# Patient Record
Sex: Male | Born: 1970 | Race: White | Hispanic: No | Marital: Married | Smoking: Current every day smoker
Health system: Southern US, Community
[De-identification: ages and names within clinical notes are randomized; demographics above are authoritative.]

## PROBLEM LIST (undated history)

## (undated) DIAGNOSIS — E119 Type 2 diabetes mellitus without complications: Secondary | ICD-10-CM

## (undated) DIAGNOSIS — F4310 Post-traumatic stress disorder, unspecified: Secondary | ICD-10-CM

## (undated) DIAGNOSIS — K219 Gastro-esophageal reflux disease without esophagitis: Secondary | ICD-10-CM

## (undated) DIAGNOSIS — K635 Polyp of colon: Secondary | ICD-10-CM

## (undated) DIAGNOSIS — F419 Anxiety disorder, unspecified: Secondary | ICD-10-CM

## (undated) HISTORY — PX: TONSILLECTOMY: SUR1361

## (undated) HISTORY — DX: Type 2 diabetes mellitus without complications: E11.9

## (undated) HISTORY — DX: Polyp of colon: K63.5

---

## 2012-12-28 ENCOUNTER — Encounter (HOSPITAL_BASED_OUTPATIENT_CLINIC_OR_DEPARTMENT_OTHER): Payer: Self-pay | Admitting: *Deleted

## 2012-12-28 ENCOUNTER — Emergency Department (HOSPITAL_BASED_OUTPATIENT_CLINIC_OR_DEPARTMENT_OTHER): Payer: BC Managed Care – PPO

## 2012-12-28 ENCOUNTER — Emergency Department (HOSPITAL_BASED_OUTPATIENT_CLINIC_OR_DEPARTMENT_OTHER)
Admission: EM | Admit: 2012-12-28 | Discharge: 2012-12-28 | Disposition: A | Discharge status: Home / Self Care | Payer: BC Managed Care – PPO | Attending: Emergency Medicine | Admitting: Emergency Medicine

## 2012-12-28 DIAGNOSIS — R079 Chest pain, unspecified: Secondary | ICD-10-CM

## 2012-12-28 DIAGNOSIS — K219 Gastro-esophageal reflux disease without esophagitis: Secondary | ICD-10-CM | POA: Insufficient documentation

## 2012-12-28 DIAGNOSIS — Z87891 Personal history of nicotine dependence: Secondary | ICD-10-CM | POA: Insufficient documentation

## 2012-12-28 DIAGNOSIS — R0789 Other chest pain: Secondary | ICD-10-CM | POA: Insufficient documentation

## 2012-12-28 DIAGNOSIS — Z79899 Other long term (current) drug therapy: Secondary | ICD-10-CM | POA: Insufficient documentation

## 2012-12-28 DIAGNOSIS — Z880 Allergy status to penicillin: Secondary | ICD-10-CM | POA: Insufficient documentation

## 2012-12-28 DIAGNOSIS — R42 Dizziness and giddiness: Secondary | ICD-10-CM | POA: Insufficient documentation

## 2012-12-28 DIAGNOSIS — E119 Type 2 diabetes mellitus without complications: Secondary | ICD-10-CM | POA: Insufficient documentation

## 2012-12-28 HISTORY — DX: Gastro-esophageal reflux disease without esophagitis: K21.9

## 2012-12-28 LAB — BASIC METABOLIC PANEL
BUN: 12 mg/dL (ref 6–23)
Chloride: 98 mEq/L (ref 96–112)
GFR calc Af Amer: >90 mL/min (ref >90)
GFR calc non Af Amer: >90 mL/min (ref >90)
Glucose, Bld: 308 mg/dL — ABNORMAL HIGH (ref 70–99)
Potassium: 3.8 mEq/L (ref 3.5–5.1)
Sodium: 135 mEq/L (ref 135–145)

## 2012-12-28 LAB — URINALYSIS, ROUTINE W REFLEX MICROSCOPIC
Bilirubin Urine: NEGATIVE
Nitrite: NEGATIVE
Specific Gravity, Urine: 1.025 (ref 1.005–1.030)
Urobilinogen, UA: 1 mg/dL (ref 0.0–1.0)
pH: 5.5 (ref 5.0–8.0)

## 2012-12-28 LAB — URINE MICROSCOPIC-ADD ON

## 2012-12-28 LAB — CBC WITH DIFFERENTIAL/PLATELET
Basophils Absolute: 0 10*3/uL (ref 0.0–0.1)
Basophils Relative: 0 % (ref 0–1)
HCT: 43.1 % (ref 39.0–52.0)
Hemoglobin: 15.5 g/dL (ref 13.0–17.0)
Lymphocytes Relative: 26 % (ref 12–46)
Monocytes Absolute: 1.1 10*3/uL — ABNORMAL HIGH (ref 0.1–1.0)
Monocytes Relative: 12 % (ref 3–12)
Neutro Abs: 5.6 10*3/uL (ref 1.7–7.7)
Neutrophils Relative %: 61 % (ref 43–77)
WBC: 9.3 10*3/uL (ref 4.0–10.5)

## 2012-12-28 MED ORDER — SODIUM CHLORIDE 0.9 % IV BOLUS (SEPSIS)
1000.0000 mL | Freq: Once | INTRAVENOUS | Status: AC
  Administered 2012-12-28: 1000 mL via INTRAVENOUS

## 2012-12-28 MED ORDER — METFORMIN HCL 500 MG PO TABS: 500.0000 mg | ORAL_TABLET | Freq: Two times a day (BID) | ORAL | Status: DC

## 2012-12-28 NOTE — ED Notes (Signed)
Chest pain on and off for over 8 months. His father died recently and the pain has gotten more frequently. He thinks it is anxiety. Lightheaded.

## 2012-12-28 NOTE — ED Notes (Signed)
NP at bedside.

## 2012-12-28 NOTE — ED Provider Notes (Signed)
CSN: [id number]     Arrival date & time 12/28/12  1337 History     First MD Initiated Contact with Patient 12/28/12 1348     Chief Complaint  Patient presents with  . Chest Pain   (Consider location/radiation/quality/duration/timing/severity/associated sxs/prior Treatment) HPI Comments: Pt states that he has intermittent cp over the last 8 months;with 3 episodes until the last couple of days and he has had pain at about a 2 and the pain is substernal and on left lateral ribs:pt states that he has not been seen, but he came in to day because he got dizzy with the episode:pt denies n/v, sob, or diaphoresis with the episodes;nothing makes it better or worse:pt states that his father died of a massive mi 3 days ago at the age of 65:pt denies knowing of previous history with father:pt states that the pain is relieved with tylenol or motrin:pt states that he has also moved in the last week so between the death and that he has been under a lot of stress:pt denies any medical history, although he has not been to a doctor recently  The history is provided by the patient. No language interpreter was used.    Past Medical History  Diagnosis Date  . GERD (gastroesophageal reflux disease)    Past Surgical History  Procedure Laterality Date  . Tonsillectomy     No family history on file. History  Substance Use Topics  . Smoking status: Former Smoker    Quit date: 11/23/2012  . Smokeless tobacco: Not on file  . Alcohol Use: No    Review of Systems  Constitutional: Negative.   HENT: Negative.   Respiratory: Negative for cough and shortness of breath.   Neurological: Positive for dizziness.    Allergies  Penicillins  Home Medications   Current Outpatient Rx  Name  Route  Sig  Dispense  Refill  . Ranitidine HCl (ZANTAC PO)   Oral   Take by mouth.          BP 140/89  Pulse 105  Temp(Src) 99 F (37.2 C) (Oral)  Resp 22  Ht 5\' 7"  (1.702 m)  Wt 225 lb (102.059 kg)  BMI 35.23  kg/m2  SpO2 98% Physical Exam  Nursing note and vitals reviewed. Constitutional: He is oriented to person, place, and time. He appears well-developed and well-nourished.  HENT:  Head: Normocephalic and atraumatic.  Eyes: Conjunctivae and EOM are normal. Pupils are equal, round, and reactive to light.  Neck: Normal range of motion. Neck supple.  Cardiovascular: Normal rate and regular rhythm.   Pulmonary/Chest: Effort normal and breath sounds normal. He exhibits tenderness.  Abdominal: Soft. Bowel sounds are normal. There is no tenderness.  Neurological: He is alert and oriented to person, place, and time.  Skin: Skin is warm and dry.  Psychiatric: He has a normal mood and affect.    ED Course   Procedures (including critical care time)  Labs Reviewed  CBC WITH DIFFERENTIAL - Abnormal; Notable for the following:    Platelets 141 (*)    Monocytes Absolute 1.1 (*)    All other components within normal limits  BASIC METABOLIC PANEL - Abnormal; Notable for the following:    Glucose, Bld 308 (*)    All other components within normal limits  URINALYSIS, ROUTINE W REFLEX MICROSCOPIC - Abnormal; Notable for the following:    Glucose, UA >1000 (*)    All other components within normal limits  TROPONIN I  URINE MICROSCOPIC-ADD ON  Dg Chest 2 View  12/28/2012   *RADIOLOGY REPORT*  Clinical Data: Left-sided chest pain  CHEST - 2 VIEW  Comparison: None.  Findings:  There is mild atelectatic change in the left base.  The lungs are otherwise clear.  Heart size and pulmonary vascularity normal.  No adenopathy.  No pneumothorax.  No bone lesions.  IMPRESSION: Mild left base atelectasis.  Study otherwise unremarkable.   Original Report Authenticated By: Bretta Bang, M.D.   Date: 12/28/2012  Rate: 98  Rhythm: normal sinus rhythm  QRS Axis: normal  Intervals: normal  ST/T Wave abnormalities: normal  Conduction Disutrbances:none  Narrative Interpretation:   Old EKG Reviewed: none  available   1. Diabetes   2. Chest pain     MDM  Pt not having cp at this time:pt is feeling better:doubt [id number] of dizziness likely related to diabetes which is a new diagnosis:pt given fluids here and orthostatics resolved:will start pt on metformin:pt has a family pcp that he can see in [place]:pt understands importance of follow up  Teressa Lower, NP 12/28/12 1634  Teressa Lower, NP 12/28/12 1634

## 2012-12-28 NOTE — ED Notes (Signed)
Provider at bedside

## 2012-12-28 NOTE — ED Provider Notes (Signed)
Medical screening examination/treatment/procedure(s) were performed by non-physician practitioner and as supervising physician I was immediately available for consultation/collaboration.   Derwood Kaplan, MD 12/28/12 [id number]

## 2013-01-14 ENCOUNTER — Ambulatory Visit (INDEPENDENT_AMBULATORY_CARE_PROVIDER_SITE_OTHER): Payer: BC Managed Care – PPO | Admitting: Family Medicine

## 2013-01-14 ENCOUNTER — Encounter: Payer: Self-pay | Admitting: Family Medicine

## 2013-01-14 VITALS — BP 126/86 | HR 67 | Resp 16 | Ht 66.0 in | Wt 215.0 lb

## 2013-01-14 DIAGNOSIS — IMO0001 Reserved for inherently not codable concepts without codable children: Secondary | ICD-10-CM

## 2013-01-14 DIAGNOSIS — R5381 Other malaise: Secondary | ICD-10-CM

## 2013-01-14 LAB — POCT GLYCOSYLATED HEMOGLOBIN (HGB A1C): Hemoglobin A1C: 8.5

## 2013-01-14 MED ORDER — GLUCOPHAGE XR 500 MG PO TB24: ORAL_TABLET | ORAL | Status: AC

## 2013-01-14 MED ORDER — GLUCOSE BLOOD VI STRP: ORAL_STRIP | Status: AC

## 2013-01-14 MED ORDER — FREESTYLE LITE DEVI: Status: AC

## 2013-01-14 MED ORDER — FREESTYLE LANCETS MISC: Status: AC

## 2013-01-14 NOTE — Patient Instructions (Addendum)
1)  Take up to 4 Glucophage daily.  Sugar Busters, Reversing Diabetes, Ending Diabetes  Cinnamon 1000 mg/Chromium 1000 mcg.     Insulin Resistance Blood sugar (glucose) levels are controlled by a hormone called insulin. Insulin is made by your pancreas. When your blood glucose goes up, insulin is released into your blood. Insulin is required for your body to function normally. However, your body can become resistant to your own insulin or to insulin given to treat diabetes. In either case, insulin resistance can lead to serious problems. These problems include:  Type 2 diabetes.  Heart disease.  High blood pressure.  Stroke.  Polycystic ovary syndrome.  Fatty liver. CAUSES  Insulin resistance can develop for many different reasons. It is more likely to happen in people with these conditions or characteristics:  Obesity.  Inactivity.  Pregnancy.  High blood pressure.  Stress.  Steroid use.  Infection or severe illness.  Increased levels of cholesterol and triglycerides. SYMPTOMS  There are no symptoms. You may have symptoms related to the various complications of insulin resistance.  DIAGNOSIS  Several different things can make your caregiver suspect you have insulin resistance. These include:  High blood glucose (hyperglycemia).  Abnormal cholesterol levels.  High uric acid levels.  Changes related to blood pressure.  Changes related to inflammation. Insulin resistance can be determined with blood tests. An elevated insulin level when you have not eaten might suggest resistance. Other more complicated tests are sometimes necessary. TREATMENT  Lifestyle changes are the most important treatment for insulin resistance.   If you are overweight and you have insulin resistance, you can improve your insulin sensitivity by losing weight.  Moderate exercise for 30 40 minutes, 4 days a week, can improve insulin sensitivity. Some medicines can also help improve your  insulin sensitivity. Your caregiver can discuss these with you if they are appropriate.  HOME CARE INSTRUCTIONS   Do not smoke.  Keep your weight at a healthy level.  Get exercise.  If you have diabetes, follow your caregiver's directions.  If you have high blood pressure, follow your caregiver's directions.  Only take prescription medicines for pain, fever, or discomfort as directed by your caregiver. SEEK MEDICAL CARE IF:   You are diabetic and you are having problems keeping your blood glucose levels at target range.  You are having episodes of low blood glucose (hypoglycemia).  You feel you might be having side effects from your medicines.  You have symptoms of an illness that is not improving after 3 4 days.  You have a sore or wound that is not healing.  You notice a change in vision or a new problem with your vision. SEEK IMMEDIATE MEDICAL CARE IF:   Your blood glucose goes below 70, especially if you have confusion, lightheadedness, or other symptoms with it.  Your blood glucose is very high (as advised by your caregiver) twice in a row.  You pass out.  You have chest pain or trouble breathing.  You have a sudden, severe headache.  You have sudden weakness in one arm or one leg.  You have sudden difficulty speaking or swallowing.  You develop vomiting or diarrhea that is getting worse or not improving after 1 day. Document Released: 07/01/2005 Document Revised: 11/11/2011 Document Reviewed: 06/29/2008 [facility] Patient Information 2014 [place], [place].  Complementary and Alternative Medical Therapies for Diabetes Complementary and alternative medicines are health care practices or products that are not always accepted as part of routine medicine. Complementary medicine is used  along with routine medicine (medical therapy). Alternative medicine can sometimes be used instead of routine medicine. Some people use these methods to treat diabetes. While some of these  therapies may be effective, others may not be. Some may even be harmful. Patients using these methods need to tell their caregiver. It is important to let your caregivers know what you are doing. Some of these therapies are discussed below. For more information, talk with your caregiver. THERAPIES Acupuncture Acupuncture is done by a professional who inserts needles into certain points on the skin. Some scientists believe that this triggers the release of the body's natural painkillers. It has been shown to relieve long-term (chronic) pain. This may help patients with painful nerve damage caused by diabetes. Biofeedback Biofeedback helps a person become more aware of the body's response to pain. It also helps you learn to deal with the pain. This alternative therapy focuses on relaxation and stress-reduction techniques. Thinking of peaceful mental images (guided imagery) is one technique. Some people believe these images can ease their condition. MEDICATIONS Chromium Several studies report that chromium supplements may improve diabetes control. Chromium helps insulin improve its action. Research is not yet certain. Supplements have not been recommended or approved. Caution is needed if you have kidney (renal) problems. Ginseng There are several types of ginseng plants. American ginseng is used for diabetes studies. Those studies have shown some glucose-lowering effects. Those effects have been seen with fasting and after-meal blood glucose levels. They have also been seen in A1c levels (average blood glucose levels over a 68-month period). More long-term studies are needed before recommendations for use of ginseng can be made. Magnesium Experts have studied the relationship between magnesium and diabetes for many years. But it is not yet fully understood. Studies suggest that a low amount of magnesium may make blood glucose control worse in type 2 diabetes. Research also shows that a low amount may  contribute to certain diabetes complications. One study showed that people who consume more magnesium had less risk of type 2 diabetes. Eating whole grains, nuts, and green leafy vegetables raises the magnesium level. Vanadium Vanadium is a compound found in tiny amounts in plants and animals. Early studies showed that vanadium improved blood glucose levels in animals with type 1 and type 2 diabetes. One study found that when given vanadium, those with diabetes were able to decrease their insulin dosage. Researchers still need to learn how it works in the body to discover any side effects, and to find safe dosages. Cinnamon There have been a couple of studies that seem to indicate cinnamon decreases insulin resistance and increases insulin production. By doing so, it may lower blood glucose. Exact doses are unknown, but it may work best when used in combination with other diabetes medicines. Document Released: 03/09/2007 Document Revised: 08/04/2011 Document Reviewed: 03/22/2009 [facility] Patient Information 2014 [place], [place].

## 2013-01-14 NOTE — Progress Notes (Signed)
  Subjective:    Patient ID: Robert Camacho, male    DOB: [birth year], 42 y.o.   MRN: [id number]  HPI  Robert Camacho is here today to establish care with our practice.  He found our name through his insurance network's website.  He has not had a PCP for several years.  He would like to discuss the following conditions:  1) Type II DM:  He came to [facility] on 12/28/12 complaining of chest pain and feeling "faint".  He was kept in "observation" for several hours.  He says that several labs were checked and the only thing that was abnormal was his blood sugar which was 308 mg/dL.  He was told that he was a diabetic and that he needed to see a PCP to manage his diabetes.  He was given a prescription for metformin 500 mg to take twice daily.     Review of Systems  Constitutional: Negative.   HENT: Negative.   Eyes: Negative.   Respiratory: Negative.   Cardiovascular: Negative.   Gastrointestinal: Negative.   Endocrine: Negative.   Genitourinary: Negative.   Musculoskeletal: Negative.   Skin: Negative.   Allergic/Immunologic: Negative.   Neurological: Negative.   Hematological: Negative.   Psychiatric/Behavioral: Negative.     Past Medical History  Diagnosis Date  . GERD (gastroesophageal reflux disease)   . Diabetes mellitus without complication     Family History  Problem Relation Age of Onset  . Diabetes Mother   . Heart disease Father     Ischemic Heart Disease  . Alcohol abuse Maternal Grandmother   . 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     Alcohol Use:  Occasional   Drug Use:  None    Diet:  Regular   Exercise:  None   Hobbies: Yard Work and Fishing                    Objective:   Physical Exam  Constitutional: He is oriented to person, place, and time. He appears well-developed and well-nourished. No distress.  HENT:  Head: Normocephalic.  Eyes: No scleral icterus.  Neck: Neck supple. No thyromegaly present.  Cardiovascular: Normal rate, regular rhythm and normal heart sounds.  Exam reveals no gallop and no friction rub.   No murmur heard. Pulmonary/Chest: Breath sounds normal. No respiratory distress. He exhibits no tenderness.  Musculoskeletal: He exhibits no edema.  Neurological: He is alert and oriented to person, place, and time.  Skin: Skin is warm and dry. No rash noted.  Psychiatric: He has a normal mood and affect. His behavior is normal. Judgment and thought content normal.          Assessment & Plan:

## 2013-01-17 LAB — COMPLETE METABOLIC PANEL WITH GFR
ALT: 60 U/L — ABNORMAL HIGH (ref 0–53)
AST: 30 U/L (ref 0–37)
Albumin: 4.7 g/dL (ref 3.5–5.2)
Alkaline Phosphatase: 48 U/L (ref 39–117)
BUN: 18 mg/dL (ref 6–23)
CO2: 28 mEq/L (ref 19–32)
Calcium: 9.5 mg/dL (ref 8.4–10.5)
Chloride: 102 mEq/L (ref 96–112)
Creat: 1.05 mg/dL (ref 0.50–1.35)
GFR, Est African American: >89 mL/min
GFR, Est Non African American: 87 mL/min
Glucose, Bld: 109 mg/dL — ABNORMAL HIGH (ref 70–99)
Potassium: 4.4 mEq/L (ref 3.5–5.3)
Sodium: 137 mEq/L (ref 135–145)
Total Bilirubin: 0.6 mg/dL (ref 0.3–1.2)
Total Protein: 7 g/dL (ref 6.0–8.3)

## 2013-01-17 LAB — LIPID PANEL
Cholesterol: 125 mg/dL (ref 0–200)
HDL: 32 mg/dL — ABNORMAL LOW (ref >39)
LDL Cholesterol: 40 mg/dL (ref 0–99)
Total CHOL/HDL Ratio: 3.9 Ratio
Triglycerides: 267 mg/dL — ABNORMAL HIGH (ref <150)
VLDL: 53 mg/dL — ABNORMAL HIGH (ref 0–40)

## 2013-01-17 LAB — TSH: TSH: 1.778 u[IU]/mL (ref 0.350–4.500)

## 2013-01-18 LAB — INSULIN, FASTING: Insulin fasting, serum: 20 u[IU]/mL (ref 3–28)

## 2013-01-18 LAB — INSULIN, RANDOM: Insulin: 20 u[IU]/mL (ref 3–28)

## 2013-01-31 ENCOUNTER — Ambulatory Visit (INDEPENDENT_AMBULATORY_CARE_PROVIDER_SITE_OTHER): Payer: BC Managed Care – PPO | Admitting: Family Medicine

## 2013-01-31 ENCOUNTER — Encounter: Payer: Self-pay | Admitting: Family Medicine

## 2013-01-31 VITALS — BP 133/87 | HR 76 | Resp 16 | Ht 66.0 in | Wt 211.0 lb

## 2013-01-31 DIAGNOSIS — R5381 Other malaise: Secondary | ICD-10-CM | POA: Insufficient documentation

## 2013-01-31 DIAGNOSIS — E669 Obesity, unspecified: Secondary | ICD-10-CM | POA: Insufficient documentation

## 2013-01-31 DIAGNOSIS — R7401 Elevation of levels of liver transaminase levels: Secondary | ICD-10-CM

## 2013-01-31 DIAGNOSIS — E781 Pure hyperglyceridemia: Secondary | ICD-10-CM

## 2013-01-31 DIAGNOSIS — IMO0001 Reserved for inherently not codable concepts without codable children: Secondary | ICD-10-CM | POA: Insufficient documentation

## 2013-01-31 DIAGNOSIS — Z0000 Encounter for general adult medical examination without abnormal findings: Secondary | ICD-10-CM

## 2013-01-31 LAB — POCT UA - MICROALBUMIN
Albumin/Creatinine Ratio, Urine, POC: 5.5
Creatinine, POC: 90.1 mg/dL
Microalbumin Ur, POC: <5 mg/L

## 2013-01-31 LAB — POCT URINALYSIS DIPSTICK
Bilirubin, UA: NEGATIVE
Blood, UA: NEGATIVE
Glucose, UA: NEGATIVE
Ketones, UA: NEGATIVE
Leukocytes, UA: NEGATIVE
Nitrite, UA: NEGATIVE
Protein, UA: NEGATIVE
Spec Grav, UA: 1.01
Urobilinogen, UA: NEGATIVE
pH, UA: 5

## 2013-01-31 MED ORDER — OMEGA-3-ACID ETHYL ESTERS 1 G PO CAPS: 2.0000 g | ORAL_CAPSULE | Freq: Two times a day (BID) | ORAL | Status: DC

## 2013-01-31 NOTE — Patient Instructions (Addendum)
1)  Triglycerides/HDL - Fish Oil/Exercise  2)  Fatty Liver - Vitamin E 400 -800 IU daily.   Hypertriglyceridemia  Diet for High blood levels of Triglycerides Most fats in food are triglycerides. Triglycerides in your blood are stored as fat in your body. High levels of triglycerides in your blood may put you at a greater risk for heart disease and stroke.  Normal triglyceride levels are less than 150 mg/dL. Borderline high levels are 150-199 mg/dl. High levels are 200 - 499 mg/dL, and very high triglyceride levels are greater than 500 mg/dL. The decision to treat high triglycerides is generally based on the level. For people with borderline or high triglyceride levels, treatment includes weight loss and exercise. Drugs are recommended for people with very high triglyceride levels. Many people who need treatment for high triglyceride levels have metabolic syndrome. This syndrome is a collection of disorders that often include: insulin resistance, high blood pressure, blood clotting problems, high cholesterol and triglycerides. TESTING PROCEDURE FOR TRIGLYCERIDES  You should not eat 4 hours before getting your triglycerides measured. The normal range of triglycerides is between 10 and 250 milligrams per deciliter (mg/dl). Some people may have extreme levels (1000 or above), but your triglyceride level may be too high if it is above 150 mg/dl, depending on what other risk factors you have for heart disease.  People with high blood triglycerides may also have high blood cholesterol levels. If you have high blood cholesterol as well as high blood triglycerides, your risk for heart disease is probably greater than if you only had high triglycerides. High blood cholesterol is one of the main risk factors for heart disease. CHANGING YOUR DIET  Your weight can affect your blood triglyceride level. If you are more than 20% above your ideal body weight, you may be able to lower your blood triglycerides by  losing weight. Eating less and exercising regularly is the best way to combat this. Fat provides more calories than any other food. The best way to lose weight is to eat less fat. Only 30% of your total calories should come from fat. Less than 7% of your diet should come from saturated fat. A diet low in fat and saturated fat is the same as a diet to decrease blood cholesterol. By eating a diet lower in fat, you may lose weight, lower your blood cholesterol, and lower your blood triglyceride level.  Eating a diet low in fat, especially saturated fat, may also help you lower your blood triglyceride level. Ask your dietitian to help you figure how much fat you can eat based on the number of calories your caregiver has prescribed for you.  Exercise, in addition to helping with weight loss may also help lower triglyceride levels.   Alcohol can increase blood triglycerides. You may need to stop drinking alcoholic beverages.  Too much carbohydrate in your diet may also increase your blood triglycerides. Some complex carbohydrates are necessary in your diet. These may include bread, rice, potatoes, other starchy vegetables and cereals.  Reduce "simple" carbohydrates. These may include pure sugars, candy, honey, and jelly without losing other nutrients. If you have the kind of high blood triglycerides that is affected by the amount of carbohydrates in your diet, you will need to eat less sugar and less high-sugar foods. Your caregiver can help you with this.  Adding 2-4 grams of fish oil (EPA+ DHA) may also help lower triglycerides. Speak with your caregiver before adding any supplements to your regimen. Following the Diet  Maintain your ideal weight. Your caregivers can help you with a diet. Generally, eating less food and getting more exercise will help you lose weight. Joining a weight control group may also help. Ask your caregivers for a good weight control group in your area.  Eat low-fat foods instead of  high-fat foods. This can help you lose weight too.  These foods are lower in fat. Eat MORE of these:   Dried beans, peas, and lentils.  Egg whites.  Low-fat cottage cheese.  Fish.  Lean cuts of meat, such as round, sirloin, rump, and flank (cut extra fat off meat you fix).  Whole grain breads, cereals and pasta.  Skim and nonfat dry milk.  Low-fat yogurt.  Poultry without the skin.  Cheese made with skim or part-skim milk, such as mozzarella, parmesan, farmers', ricotta, or pot cheese. These are higher fat foods. Eat LESS of these:   Whole milk and foods made from whole milk, such as American, blue, cheddar, monterey jack, and swiss cheese  High-fat meats, such as luncheon meats, sausages, knockwurst, bratwurst, hot dogs, ribs, corned beef, ground pork, and regular ground beef.  Fried foods. Limit saturated fats in your diet. Substituting unsaturated fat for saturated fat may decrease your blood triglyceride level. You will need to read package labels to know which products contain saturated fats.  These foods are high in saturated fat. Eat LESS of these:   Fried pork skins.  Whole milk.  Skin and fat from poultry.  Palm oil.  Butter.  Shortening.  Cream cheese.  Tomasa Blase.  Margarines and baked goods made from listed oils.  Vegetable shortenings.  Chitterlings.  Fat from meats.  Coconut oil.  Palm kernel oil.  Lard.  Cream.  Sour cream.  Fatback.  Coffee whiteners and non-dairy creamers made with these oils.  Cheese made from whole milk. Use unsaturated fats (both polyunsaturated and monounsaturated) moderately. Remember, even though unsaturated fats are better than saturated fats; you still want a diet low in total fat.  These foods are high in unsaturated fat:   Canola oil.  Sunflower oil.  Mayonnaise.  Almonds.  Peanuts.  Pine nuts.  Margarines made with these oils.  Safflower oil.  Olive oil.  Avocados.  Cashews.  Peanut  butter.  Sunflower seeds.  Soybean oil.  Peanut oil.  Olives.  Pecans.  Walnuts.  Pumpkin seeds. Avoid sugar and other high-sugar foods. This will decrease carbohydrates without decreasing other nutrients. Sugar in your food goes rapidly to your blood. When there is excess sugar in your blood, your liver may use it to make more triglycerides. Sugar also contains calories without other important nutrients.  Eat LESS of these:   Sugar, brown sugar, powdered sugar, jam, jelly, preserves, honey, syrup, molasses, pies, candy, cakes, cookies, frosting, pastries, colas, soft drinks, punches, fruit drinks, and regular gelatin.  Avoid alcohol. Alcohol, even more than sugar, may increase blood triglycerides. In addition, alcohol is high in calories and low in nutrients. Ask for sparkling water, or a diet soft drink instead of an alcoholic beverage. Suggestions for planning and preparing meals   Bake, broil, grill or roast meats instead of frying.  Remove fat from meats and skin from poultry before cooking.  Add spices, herbs, lemon juice or vinegar to vegetables instead of salt, rich sauces or gravies.  Use a non-stick skillet without fat or use no-stick sprays.  Cool and refrigerate stews and broth. Then remove the hardened fat floating on the surface before serving.  Refrigerate meat drippings and skim off fat to make low-fat gravies.  Serve more fish.  Use less butter, margarine and other high-fat spreads on bread or vegetables.  Use skim or reconstituted non-fat dry milk for cooking.  Cook with low-fat cheeses.  Substitute low-fat yogurt or cottage cheese for all or part of the sour cream in recipes for sauces, dips or congealed salads.  Use half yogurt/half mayonnaise in salad recipes.  Substitute evaporated skim milk for cream. Evaporated skim milk or reconstituted non-fat dry milk can be whipped and substituted for whipped cream in certain recipes.  Choose fresh fruits  for dessert instead of high-fat foods such as pies or cakes. Fruits are naturally low in fat. When Dining Out   Order low-fat appetizers such as fruit or vegetable juice, pasta with vegetables or tomato sauce.  Select clear, rather than cream soups.  Ask that dressings and gravies be served on the side. Then use less of them.  Order foods that are baked, broiled, poached, steamed, stir-fried, or roasted.  Ask for margarine instead of butter, and use only a small amount.  Drink sparkling water, unsweetened tea or coffee, or diet soft drinks instead of alcohol or other sweet beverages. QUESTIONS AND ANSWERS ABOUT OTHER FATS IN THE BLOOD: SATURATED FAT, TRANS FAT, AND CHOLESTEROL What is trans fat? Trans fat is a type of fat that is formed when vegetable oil is hardened through a process called hydrogenation. This process helps makes foods more solid, gives them shape, and prolongs their shelf life. Trans fats are also called hydrogenated or partially hydrogenated oils.  What do saturated fat, trans fat, and cholesterol in foods have to do with heart disease? Saturated fat, trans fat, and cholesterol in the diet all raise the level of LDL "bad" cholesterol in the blood. The higher the LDL cholesterol, the greater the risk for coronary heart disease (CHD). Saturated fat and trans fat raise LDL similarly.  What foods contain saturated fat, trans fat, and cholesterol? High amounts of saturated fat are found in animal products, such as fatty cuts of meat, chicken skin, and full-fat dairy products like butter, whole milk, cream, and cheese, and in tropical vegetable oils such as palm, palm kernel, and coconut oil. Trans fat is found in some of the same foods as saturated fat, such as vegetable shortening, some margarines (especially hard or stick margarine), crackers, cookies, baked goods, fried foods, salad dressings, and other processed foods made with partially hydrogenated vegetable oils. Small  amounts of trans fat also occur naturally in some animal products, such as milk products, beef, and lamb. Foods high in cholesterol include liver, other organ meats, egg yolks, shrimp, and full-fat dairy products. How can I use the new food label to make heart-healthy food choices? Check the Nutrition Facts panel of the food label. Choose foods lower in saturated fat, trans fat, and cholesterol. For saturated fat and cholesterol, you can also use the Percent Daily Value (%DV): 5% DV or less is low, and 20% DV or more is high. (There is no %DV for trans fat.) Use the Nutrition Facts panel to choose foods low in saturated fat and cholesterol, and if the trans fat is not listed, read the ingredients and limit products that list shortening or hydrogenated or partially hydrogenated vegetable oil, which tend to be high in trans fat. POINTS TO REMEMBER:   Discuss your risk for heart disease with your caregivers, and take steps to reduce risk factors.  Change your diet. Choose  foods that are low in saturated fat, trans fat, and cholesterol.  Add exercise to your daily routine if it is not already being done. Participate in physical activity of moderate intensity, like brisk walking, for at least 30 minutes on most, and preferably all days of the week. No time? Break the 30 minutes into three, 10-minute segments during the day.  Stop smoking. If you do smoke, contact your caregiver to discuss ways in which they can help you quit.  Do not use street drugs.  Maintain a normal weight.  Maintain a healthy blood pressure.  Keep up with your blood work for checking the fats in your blood as directed by your caregiver. Document Released: 02/28/2004 Document Revised: 11/11/2011 Document Reviewed: 09/25/2008 [facility] Patient Information 2014 [place], [place].  Fatty Liver Fatty liver is the accumulation of fat in liver cells. It is also called hepatosteatosis or steatohepatitis. It is normal for your liver to  contain some fat. If fat is more than 5 to 10% of your liver's weight, you have fatty liver.  There are often no symptoms (problems) for years while damage is still occurring. People often learn about their fatty liver when they have medical tests for other reasons. Fat can damage your liver for years or even decades without causing problems. When it becomes severe, it can cause fatigue, weight loss, weakness, and confusion. This makes you more likely to develop more serious liver problems. The liver is the largest organ in the body. It does a lot of work and often gives no warning signs when it is sick until late in a disease. The liver has many important jobs including:  Breaking down foods.  Storing vitamins, iron, and other minerals.  Making proteins.  Making bile for food digestion.  Breaking down many products including medications, alcohol and some poisons. CAUSES  There are a number of different conditions, medications, and poisons that can cause a fatty liver. Eating too many calories causes fat to build up in the liver. Not processing and breaking fats down normally may also cause this. Certain conditions, such as obesity, diabetes, and high triglycerides also cause this. Most fatty liver patients tend to be middle-aged and over weight.  Some causes of fatty liver are:  Alcohol over consumption.  Malnutrition.  Steroid use.  Valproic acid toxicity.  Obesity.  Cushing's syndrome.  Poisons.  Tetracycline in high dosages.  Pregnancy.  Diabetes.  Hyperlipidemia.  Rapid weight loss. Some people develop fatty liver even having none of these conditions. SYMPTOMS  Fatty liver most often causes no problems. This is called asymptomatic.  It can be diagnosed with blood tests and also by a liver biopsy.  It is one of the most common causes of minor elevations of liver enzymes on routine blood tests.  Specialized Imaging of the liver using ultrasound, CT (computed  tomography) scan, or MRI (magnetic resonance imaging) can suggest a fatty liver but a biopsy is needed to confirm it.  A biopsy involves taking a small sample of liver tissue. This is done by using a needle. It is then looked at under a microscope by a specialist. TREATMENT  It is important to treat the cause. Simple fatty liver without a medical reason may not need treatment.  Weight loss, fat restriction, and exercise in overweight patients produces inconsistent results but is worth trying.  Fatty liver due to alcohol toxicity may not improve even with stopping drinking.  Good control of diabetes may reduce fatty liver.  Lower your  triglycerides through diet, medication or both.  Eat a balanced, healthy diet.  Increase your physical activity.  Get regular checkups from a liver specialist.  There are no medical or surgical treatments for a fatty liver or NASH, but improving your diet and increasing your exercise may help prevent or reverse some of the damage. PROGNOSIS  Fatty liver may cause no damage or it can lead to an inflammation of the liver. This is, called steatohepatitis. When it is linked to alcohol abuse, it is called alcoholic steatohepatitis. It often is not linked to alcohol. It is then called nonalcoholic steatohepatitis, or NASH. Over time the liver may become scarred and hardened. This condition is called cirrhosis. Cirrhosis is serious and may lead to liver failure or cancer. NASH is one of the leading causes of cirrhosis. About 10-20% of Americans have fatty liver and a smaller 2-5% has NASH. Document Released: 06/27/2005 Document Revised: 08/04/2011 Document Reviewed: 08/20/2005 [facility] Patient Information 2014 [place], [place].  Diabetes and Standards of Medical Care  Diabetes is complicated. You may find that your diabetes team includes a dietitian, nurse, diabetes educator, eye doctor, and more. To help everyone know what is going on and to help you get the care you  deserve, the following schedule of care was developed to help keep you on track. Below are the tests, exams, vaccines, medicines, education, and plans you will need. A1c test  Performed at least 2 times a year if you are meeting treatment goals.  Performed 4 times a year if therapy has changed or if you are not meeting treatment goals. Blood pressure test  Performed at every routine medical visit. The goal is less than 120/80 mmHg. Dental exam  Follow up with the dentist regularly. Eye exam  Diagnosed with type 1 diabetes as a child: Get an exam upon reaching the age of 10 years or older and having had diabetes for 3 5 years. Yearly eye exams are recommended after that initial eye exam.  Diagnosed with type 1 diabetes as an adult: Get an exam within 5 years of diagnosis and then yearly.  Diagnosed with type 2 diabetes: Get an exam as soon as possible after the diagnosis and then yearly. Foot care exam  Visual foot exams are performed at every routine medical visit. The exams check for cuts, injuries, or other problems with the feet.  A comprehensive foot exam should be done yearly. This includes visual inspection as well as assessing foot pulses and testing for loss of sensation. Kidney function test (urine microalbumin)  Performed once a year.  Type 1 diabetes: The first test is performed 5 years after diagnosis.  Type 2 diabetes: The first test is performed at the time of diagnosis.  A serum creatinine and estimated glomerular filtration rate (eGFR) test is done once a year to tell the level of chronic kidney disease (CKD), if present. Lipid profile (Cholesterol, HDL, LDL, Triglycerides)  Performed every 5 years for most people.  The goal for LDL is less than 100 mg/dl. If at high risk, the goal is less than 70 mg/dl.  The goal for HDL is 40 mg/dl 50 mg/dl for men and 50 mg/dl 60 mg/dl for women. An HDL cholesterol of 60 mg/dL or higher gives some protection against heart  disease.  The goal for triglycerides is less than 150 mg/dl. Influenza vaccine, pneumococcal vaccine, and hepatitis B vaccine  The influenza vaccine is recommended yearly.  The pneumococcal vaccine is generally given once in a lifetime. However,  there are some instances when another vaccination is recommended. Check with your caregiver.  The hepatitis B vaccine is also recommended for adults with diabetes. Diabetes self-management education  Recommended at diagnosis and ongoing as needed. Treatment plan  Reviewed at every medical visit. Document Released: 03/09/2009 Document Revised: 04/28/2012 Document Reviewed: 11/12/2010 [facility] Patient Information 2014 [place], [place].

## 2013-01-31 NOTE — Assessment & Plan Note (Signed)
Checking a CBC and TSH.  His energy will improve when his sugars are under better control.

## 2013-01-31 NOTE — Progress Notes (Signed)
  Subjective:    Patient ID: Robert Camacho, male    DOB: October 28, 1970, 42 y.o.   MRN: [id number]  HPI  Robert Camacho is here today for a CPE and to discuss his lab results.  He continues to check his sugars regularly. He tried to take Metformin 2000 mg but was unable to tolerate it.  He says that he developed severe nausea and diarrhea.  He is only able to tolerate 1000 mg.   He has been working harder on his diet and has been trying to eliminate animal products from  his diet.    Review of Systems  Constitutional: Negative.   HENT: Negative.   Eyes: Negative.   Respiratory: Negative.   Cardiovascular: Negative.   Gastrointestinal: Negative.   Endocrine: Negative.   Genitourinary: Negative.   Musculoskeletal: Negative.   Skin: Negative.   Allergic/Immunologic: Negative.   Neurological: Negative.   Hematological: Negative.   Psychiatric/Behavioral: Negative.     Past Medical History  Diagnosis Date  . GERD (gastroesophageal reflux disease)   . Diabetes mellitus without complication     Past Surgical History  Procedure Laterality Date  . Tonsillectomy      Family History  Problem Relation Age of Onset  . Diabetes Mother   . Heart disease Father     Ischemic Heart Disease  . Alcohol abuse Maternal Grandmother   . 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     Alcohol Use:  Occasional   Drug Use:  None   Diet:  Regular   Exercise:  None   Hobbies: Yard Work and Fishing                   Objective:   Physical Exam  Vitals reviewed. Constitutional: He is oriented to person, place, and  time. He appears well-developed and well-nourished. No distress.  HENT:  Nose: Nose normal.  Mouth/Throat: No oropharyngeal exudate.  Eyes: Conjunctivae are normal. No scleral icterus.  Neck: Normal range of motion. Neck supple. No thyromegaly present.  Cardiovascular: Normal rate, regular rhythm and normal heart sounds.   No murmur heard. Pulmonary/Chest: Effort normal and breath sounds normal.  Abdominal: Soft. Bowel sounds are normal. He exhibits no mass. There is no tenderness. Hernia confirmed negative in the right inguinal area and confirmed negative in the left inguinal area.  Genitourinary: Testes normal and penis normal. Circumcised.  Musculoskeletal: Normal range of motion. He exhibits no edema and no tenderness.  Lymphadenopathy:    He has no cervical adenopathy.       Right: No inguinal adenopathy present.       Left: No inguinal adenopathy present.  Neurological: He is alert and oriented to person, place, and time.  Skin: Skin is warm and dry. No rash noted.  Tattoos:  Cross/Redeemed (Upper Right Arm) Letter E (Upper Left Arm)   Psychiatric: He has a normal mood and affect. His behavior is normal. Judgment and thought content normal.      Assessment & Plan:

## 2013-01-31 NOTE — Assessment & Plan Note (Addendum)
His A1c is elevated at 8.5.  His daily sugars are much better than they were when he was in the ED.  He was given prescriptions for diabetic supplies at today's visit.  We discussed other medications and he would prefer to just be on metformin for now.  We'll switch him to metformin ER and he'll see if he can increase his dosage to 4 per day.  He is working hard on his diet and exercise and wants to continue on this course for now.

## 2013-02-15 ENCOUNTER — Ambulatory Visit: Payer: BC Managed Care – PPO | Admitting: Family Medicine

## 2013-02-15 ENCOUNTER — Encounter: Payer: Self-pay | Admitting: Family Medicine

## 2013-02-15 DIAGNOSIS — R7401 Elevation of levels of liver transaminase levels: Secondary | ICD-10-CM | POA: Insufficient documentation

## 2013-02-15 DIAGNOSIS — E781 Pure hyperglyceridemia: Secondary | ICD-10-CM | POA: Insufficient documentation

## 2013-02-15 DIAGNOSIS — Z0000 Encounter for general adult medical examination without abnormal findings: Secondary | ICD-10-CM | POA: Insufficient documentation

## 2013-02-15 NOTE — Assessment & Plan Note (Addendum)
His ALT is mildly elevated which is most likely due to a fatty liver.  He is to take 400 IU of Vitamin E.

## 2013-02-15 NOTE — Assessment & Plan Note (Signed)
Normal exam.  We discussed preventative issues for his age.

## 2013-02-15 NOTE — Assessment & Plan Note (Addendum)
We discussed his lipid panel results.  He is not interested in being on a statin.  He was given a prescription for Lovaza and will compare the cost of it with OTC fish oil.   We'll recheck his level in 3 months when we recheck his A1c.

## 2013-02-15 NOTE — Assessment & Plan Note (Signed)
He will continue to work on his diet and exercise.

## 2013-02-21 ENCOUNTER — Ambulatory Visit (INDEPENDENT_AMBULATORY_CARE_PROVIDER_SITE_OTHER): Payer: BC Managed Care – PPO | Admitting: Family Medicine

## 2013-02-21 ENCOUNTER — Encounter: Payer: Self-pay | Admitting: Family Medicine

## 2013-02-21 VITALS — BP 114/78 | HR 77 | Resp 16 | Wt 208.0 lb

## 2013-02-21 DIAGNOSIS — E781 Pure hyperglyceridemia: Secondary | ICD-10-CM

## 2013-02-21 DIAGNOSIS — IMO0001 Reserved for inherently not codable concepts without codable children: Secondary | ICD-10-CM

## 2013-02-21 NOTE — Patient Instructions (Addendum)
1)  Type II DM-  I have faxed your recent notes and labs over to Dr. Corwin Levins.  He will review your information and will decide if he should see you.  If he does not feel that his services are warranted at this time, I will ask him to let you see a diabetic educator and/or a nutritionist.     Diabetes and Standards of Medical Care  Diabetes is complicated. You may find that your diabetes team includes a dietitian, nurse, diabetes educator, eye doctor, and more. To help everyone know what is going on and to help you get the care you deserve, the following schedule of care was developed to help keep you on track. Below are the tests, exams, vaccines, medicines, education, and plans you will need. A1c test  Performed at least 2 times a year if you are meeting treatment goals.  Performed 4 times a year if therapy has changed or if you are not meeting treatment goals. Blood pressure test  Performed at every routine medical visit. The goal is less than 120/80 mmHg. Dental exam  Follow up with the dentist regularly. Eye exam  Diagnosed with type 1 diabetes as a child: Get an exam upon reaching the age of 10 years or older and having had diabetes for 3 5 years. Yearly eye exams are recommended after that initial eye exam.  Diagnosed with type 1 diabetes as an adult: Get an exam within 5 years of diagnosis and then yearly.  Diagnosed with type 2 diabetes: Get an exam as soon as possible after the diagnosis and then yearly. Foot care exam  Visual foot exams are performed at every routine medical visit. The exams check for cuts, injuries, or other problems with the feet.  A comprehensive foot exam should be done yearly. This includes visual inspection as well as assessing foot pulses and testing for loss of sensation. Kidney function test (urine microalbumin)  Performed once a year.  Type 1 diabetes: The first test is performed 5 years after diagnosis.  Type 2 diabetes: The first test is  performed at the time of diagnosis.  A serum creatinine and estimated glomerular filtration rate (eGFR) test is done once a year to tell the level of chronic kidney disease (CKD), if present. Lipid profile (Cholesterol, HDL, LDL, Triglycerides)  Performed every 5 years for most people.  The goal for LDL is less than 100 mg/dl. If at high risk, the goal is less than 70 mg/dl.  The goal for HDL is 40 mg/dl 50 mg/dl for men and 50 mg/dl 60 mg/dl for women. An HDL cholesterol of 60 mg/dL or higher gives some protection against heart disease.  The goal for triglycerides is less than 150 mg/dl. Influenza vaccine, pneumococcal vaccine, and hepatitis B vaccine  The influenza vaccine is recommended yearly.  The pneumococcal vaccine is generally given once in a lifetime. However, there are some instances when another vaccination is recommended. Check with your caregiver.  The hepatitis B vaccine is also recommended for adults with diabetes. Diabetes self-management education  Recommended at diagnosis and ongoing as needed. Treatment plan  Reviewed at every medical visit. Document Released: 03/09/2009 Document Revised: 04/28/2012 Document Reviewed: 11/12/2010 [facility] Patient Information 2014 [place], [place].

## 2013-02-21 NOTE — Assessment & Plan Note (Signed)
I told Robert Camacho that I was not sure that he needs to see an endocrinologist at this point since he is newly diagnosed and is only on metformin.  We have discussed other medications for him to take and at this point this is the only one he wants to be on.  I told him that Dr. Michaelle Copas office will review my notes and will decide if they'll set him up.  If Dr. Katrinka Blazing does not feel that it is appropriate for him to see him at this point, I will ask them if he can see a diabetic educator and/or a nutrtionist.  He said that he is interested in doing this.

## 2013-02-21 NOTE — Assessment & Plan Note (Signed)
His triglycerides were high.   He was started on Lovaza.  He has seen that his FBS has been a little higher since going on the Lovaza.

## 2013-02-21 NOTE — Progress Notes (Signed)
  Subjective:    Patient ID: Robert Camacho, male    DOB: 01/07/1971, 42 y.o.   MRN: [id number]  HPI  Robert Camacho is here today with his wife wanting to be referred to an endocrinologist.  He was diagnosed with Type II DM at the [facility] ED when he went there to be evaluated for chest pain.  Labs showed that his blood sugar was 308 mg/dL and his urine showed >4098 mg/dL.  His  A1c was elevated at 8.5 %.  He was started on metformin and a month later his blood sugar was much improved and his urine was negative.  He was talking with someone at his job who feels that he should be seen by an endocrinologist and he would like to be referred to [employer] at the [employer].     Review of Systems  Constitutional: Negative.   HENT: Negative.   Eyes: Negative.   Respiratory: Negative.   Cardiovascular: Negative.   Gastrointestinal: Negative.   Endocrine: Negative.   Genitourinary: Negative.   Musculoskeletal: Negative.   Skin: Negative.   Allergic/Immunologic: Negative.   Neurological: Negative.   Hematological: Negative.   Psychiatric/Behavioral: Negative.     Past Medical History  Diagnosis Date  . GERD (gastroesophageal reflux disease)   . Diabetes mellitus without complication     Family History  Problem Relation Age of Onset  . Diabetes Mother   . Heart disease Father     Ischemic Heart Disease  . Alcohol abuse Maternal Grandmother   . 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     Alcohol Use:  Occasional   Drug Use:  None   Diet:  Regular   Exercise:  None   Hobbies: Yard  Work and Fishing                   Objective:   Physical Exam  Vitals reviewed. Constitutional: He is oriented to person, place, and time. He appears well-developed and well-nourished.  Cardiovascular: Normal rate.   Pulmonary/Chest: Effort normal. No respiratory distress.  Neurological: He is alert and oriented to person, place, and time.  Psychiatric: He has a normal mood and affect. His behavior is normal. Judgment and thought content normal.     Assessment & Plan:

## 2013-03-31 ENCOUNTER — Other Ambulatory Visit: Payer: Self-pay

## 2013-09-01 ENCOUNTER — Other Ambulatory Visit: Payer: Self-pay

## 2013-10-19 ENCOUNTER — Ambulatory Visit (INDEPENDENT_AMBULATORY_CARE_PROVIDER_SITE_OTHER): Payer: BC Managed Care – PPO | Admitting: Family Medicine

## 2013-10-19 ENCOUNTER — Encounter: Payer: Self-pay | Admitting: Family Medicine

## 2013-10-19 VITALS — BP 133/88 | HR 87 | Resp 16 | Ht 66.0 in | Wt 208.0 lb

## 2013-10-19 DIAGNOSIS — D126 Benign neoplasm of colon, unspecified: Secondary | ICD-10-CM

## 2013-10-19 DIAGNOSIS — K635 Polyp of colon: Secondary | ICD-10-CM

## 2013-10-19 DIAGNOSIS — R1012 Left upper quadrant pain: Secondary | ICD-10-CM

## 2013-10-19 DIAGNOSIS — M545 Low back pain, unspecified: Secondary | ICD-10-CM

## 2013-10-19 DIAGNOSIS — K219 Gastro-esophageal reflux disease without esophagitis: Secondary | ICD-10-CM

## 2013-10-19 MED ORDER — CYCLOBENZAPRINE HCL 5 MG PO TABS: 5.0000 mg | ORAL_TABLET | Freq: Every day | ORAL | Status: AC

## 2013-10-19 MED ORDER — PANTOPRAZOLE SODIUM 40 MG PO TBEC: 40.0000 mg | DELAYED_RELEASE_TABLET | Freq: Every day | ORAL | Status: AC

## 2013-10-19 NOTE — Progress Notes (Signed)
Subjective:    Patient ID: Robert Camacho, male    DOB: 1970-12-17, 43 y.o.   MRN: [id number]  HPI  Jathan is here today concerned about some pain he has been having in his upper left abdomen that runs around to his back. He says that it first started several weeks ago and lasted about 5 days before going away. The pain came back recently.  He doesn't associate it with eating.  He says that he has had some increased burping recently and has been taking a lot of Zantac.   He is concerned that these problems might be related to his gallbladder.     Review of Systems  Constitutional: Negative for fatigue.  Gastrointestinal: Positive for abdominal pain. Negative for diarrhea, constipation and blood in stool.       LUQ Pain  Musculoskeletal: Positive for back pain.  All other systems reviewed and are negative.    Past Medical History  Diagnosis Date  . GERD (gastroesophageal reflux disease)   . Diabetes mellitus without complication   . Colon polyp      Past Surgical History  Procedure Laterality Date  . 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     Alcohol Use:  Occasional   Drug Use:  None   Diet:  Regular   Exercise:  None   Hobbies: Yard Work English as a second language teacher                  Family History  Problem Relation Age of Onset  . Diabetes Mother   . Heart disease Father     Ischemic Heart Disease  . Alcohol abuse Maternal Grandmother   . Diabetes Paternal Grandmother      Current Outpatient Prescriptions on File Prior to Visit  Medication Sig Dispense Refill  . Blood Glucose Monitoring Suppl  (FREESTYLE LITE) DEVI Check sugar daily  1 each  0  . GLUCOPHAGE XR 500 MG 24 hr tablet Take 1-4 tabs po daily  120 tablet  2  . glucose blood (FREESTYLE LITE) test strip Check sugar daily  100 each  12  . Lancets (FREESTYLE) lancets Use as instructed  100 each  12  . Ranitidine HCl (ZANTAC PO) Take by mouth.       No current facility-administered medications on file prior to visit.     Allergies  Allergen Reactions  . Penicillins Anaphylaxis       Objective:   Physical Exam  Nursing note and vitals reviewed. Constitutional: He is oriented to person, place, and time. He appears well-developed and well-nourished.  Cardiovascular: Normal rate.   Pulmonary/Chest: Effort normal. No respiratory distress.  Abdominal: Soft.  Neurological: He is alert and oriented to person, place, and time.  Psychiatric: He has a normal mood and affect. His behavior is normal. Judgment and thought content normal.      Assessment & Plan:    Narada was seen today for luq pain.  Diagnoses and associated orders for this visit:  LUQ abdominal pain - Ambulatory referral  to Gastroenterology  Colon polyp - Ambulatory referral to Gastroenterology  GERD (gastroesophageal reflux disease) - pantoprazole (PROTONIX) 40 MG tablet; Take 1 tablet (40 mg total) by mouth daily.  Back pain - cyclobenzaprine (FLEXERIL) 5 MG tablet; Take 1 tablet (5 mg total) by mouth at bedtime.

## 2013-10-19 NOTE — Patient Instructions (Addendum)
1)  Back Pain - Try the Flexeril 5 mg at night and take Advil 400 mg plus Tylenol 1000 mg up to 3 x per day.  Soak in hot bath with Epsom Salt.  Consider a massage vs chiropractic adjustment (Dr. Raynelle Chary).  2)  GI - I made a referral to Dr. Carlean Purl. Try the Prontonix 40 mg daily until you see him to see if this improves your symptoms.  I'll let him decide what type of imaging if any you need in addition to the colonoscopy.     Lumbosacral Strain Lumbosacral strain is a strain of any of the parts that make up your lumbosacral vertebrae. Your lumbosacral vertebrae are the bones that make up the lower third of your backbone. Your lumbosacral vertebrae are held together by muscles and tough, fibrous tissue (ligaments).  CAUSES  A sudden blow to your back can cause lumbosacral strain. Also, anything that causes an excessive stretch of the muscles in the low back can cause this strain. This is typically seen when people exert themselves strenuously, fall, lift heavy objects, bend, or crouch repeatedly. RISK FACTORS  Physically demanding work.  Participation in pushing or pulling sports or sports that require sudden twist of the back (tennis, golf, baseball).  Weight lifting.  Excessive lower back curvature.  Forward-tilted pelvis.  Weak back or abdominal muscles or both.  Tight hamstrings. SIGNS AND SYMPTOMS  Lumbosacral strain may cause pain in the area of your injury or pain that moves (radiates) down your leg.  DIAGNOSIS Your health care provider can often diagnose lumbosacral strain through a physical exam. In some cases, you may need tests such as X-ray exams.  TREATMENT  Treatment for your lower back injury depends on many factors that your clinician will have to evaluate. However, most treatment will include the use of anti-inflammatory medicines. HOME CARE INSTRUCTIONS   Avoid hard physical activities (tennis, racquetball, waterskiing) if you are not in proper physical condition for  it. This may aggravate or create problems.  If you have a back problem, avoid sports requiring sudden body movements. Swimming and walking are generally safer activities.  Maintain good posture.  Maintain a healthy weight.  For acute conditions, you may put ice on the injured area.  Put ice in a plastic bag.  Place a towel between your skin and the bag.  Leave the ice on for 20 minutes, 2 3 times a day.  When the low back starts healing, stretching and strengthening exercises may be recommended. SEEK MEDICAL CARE IF:  Your back pain is getting worse.  You experience severe back pain not relieved with medicines. SEEK IMMEDIATE MEDICAL CARE IF:   You have numbness, tingling, weakness, or problems with the use of your arms or legs.  There is a change in bowel or bladder control.  You have increasing pain in any area of the body, including your belly (abdomen).  You notice shortness of breath, dizziness, or feel faint.  You feel sick to your stomach (nauseous), are throwing up (vomiting), or become sweaty.  You notice discoloration of your toes or legs, or your feet get very cold. MAKE SURE YOU:   Understand these instructions.  Will watch your condition.  Will get help right away if you are not doing well or get worse. Document Released: 02/19/2005 Document Revised: 03/02/2013 Document Reviewed: 12/29/2012 [facility] Patient Information 2014 [place], [place].

## 2013-11-21 ENCOUNTER — Encounter: Payer: Self-pay | Admitting: Family Medicine

## 2014-01-06 DIAGNOSIS — M545 Low back pain, unspecified: Secondary | ICD-10-CM | POA: Insufficient documentation

## 2014-01-06 DIAGNOSIS — R1012 Left upper quadrant pain: Secondary | ICD-10-CM | POA: Insufficient documentation

## 2014-01-06 DIAGNOSIS — K219 Gastro-esophageal reflux disease without esophagitis: Secondary | ICD-10-CM | POA: Insufficient documentation

## 2014-01-10 ENCOUNTER — Telehealth: Payer: Self-pay | Admitting: Family Medicine

## 2014-01-10 NOTE — Telephone Encounter (Signed)
A GI referral was made for Robert Camacho at his last office visit.  He did not follow up with them.  He was contacted by our office and said that his symptoms improved after taking the muscle relaxer he was given.  (RZ)

## 2014-08-13 ENCOUNTER — Emergency Department (HOSPITAL_COMMUNITY): Payer: BLUE CROSS/BLUE SHIELD

## 2014-08-13 ENCOUNTER — Encounter (HOSPITAL_COMMUNITY): Payer: Self-pay | Admitting: *Deleted

## 2014-08-13 ENCOUNTER — Emergency Department (HOSPITAL_COMMUNITY)
Admission: EM | Admit: 2014-08-13 | Discharge: 2014-08-13 | Disposition: A | Discharge status: Home / Self Care | Payer: BLUE CROSS/BLUE SHIELD | Attending: Emergency Medicine | Admitting: Emergency Medicine

## 2014-08-13 DIAGNOSIS — Y9389 Activity, other specified: Secondary | ICD-10-CM | POA: Diagnosis not present

## 2014-08-13 DIAGNOSIS — Y998 Other external cause status: Secondary | ICD-10-CM | POA: Diagnosis not present

## 2014-08-13 DIAGNOSIS — F10120 Alcohol abuse with intoxication, uncomplicated: Secondary | ICD-10-CM | POA: Insufficient documentation

## 2014-08-13 DIAGNOSIS — W19XXXA Unspecified fall, initial encounter: Secondary | ICD-10-CM

## 2014-08-13 DIAGNOSIS — S0990XA Unspecified injury of head, initial encounter: Secondary | ICD-10-CM | POA: Diagnosis present

## 2014-08-13 DIAGNOSIS — Y9289 Other specified places as the place of occurrence of the external cause: Secondary | ICD-10-CM | POA: Insufficient documentation

## 2014-08-13 DIAGNOSIS — W1839XA Other fall on same level, initial encounter: Secondary | ICD-10-CM | POA: Insufficient documentation

## 2014-08-13 DIAGNOSIS — Z720 Tobacco use: Secondary | ICD-10-CM | POA: Diagnosis not present

## 2014-08-13 DIAGNOSIS — Z8659 Personal history of other mental and behavioral disorders: Secondary | ICD-10-CM | POA: Diagnosis not present

## 2014-08-13 DIAGNOSIS — S0101XA Laceration without foreign body of scalp, initial encounter: Secondary | ICD-10-CM | POA: Insufficient documentation

## 2014-08-13 DIAGNOSIS — F10920 Alcohol use, unspecified with intoxication, uncomplicated: Secondary | ICD-10-CM

## 2014-08-13 HISTORY — DX: Anxiety disorder, unspecified: F41.9

## 2014-08-13 HISTORY — DX: Post-traumatic stress disorder, unspecified: F43.10

## 2014-08-13 LAB — COMPREHENSIVE METABOLIC PANEL
ALT: 20 U/L (ref 0–53)
AST: 19 U/L (ref 0–37)
Albumin: 3.7 g/dL (ref 3.5–5.2)
Alkaline Phosphatase: 47 U/L (ref 39–117)
Anion gap: 9 (ref 5–15)
BUN: 14 mg/dL (ref 6–23)
CO2: 23 mmol/L (ref 19–32)
CREATININE: 0.91 mg/dL (ref 0.50–1.35)
Calcium: 8.5 mg/dL (ref 8.4–10.5)
Chloride: 102 mmol/L (ref 96–112)
GFR calc Af Amer: >90 mL/min (ref >90)
GFR calc non Af Amer: >90 mL/min (ref >90)
GLUCOSE: 192 mg/dL — AB (ref 70–99)
POTASSIUM: 3.9 mmol/L (ref 3.5–5.1)
Sodium: 134 mmol/L — ABNORMAL LOW (ref 135–145)
Total Bilirubin: 0.4 mg/dL (ref 0.3–1.2)
Total Protein: 6.2 g/dL (ref 6.0–8.3)

## 2014-08-13 LAB — PREPARE FRESH FROZEN PLASMA
Unit division: 0
Unit division: 0

## 2014-08-13 LAB — CBC
HCT: 41.3 % (ref 39.0–52.0)
Hemoglobin: 14 g/dL (ref 13.0–17.0)
MCH: 29.6 pg (ref 26.0–34.0)
MCHC: 33.9 g/dL (ref 30.0–36.0)
MCV: 87.3 fL (ref 78.0–100.0)
PLATELETS: 184 10*3/uL (ref 150–400)
RBC: 4.73 MIL/uL (ref 4.22–5.81)
RDW: 13.9 % (ref 11.5–15.5)
WBC: 12.1 10*3/uL — AB (ref 4.0–10.5)

## 2014-08-13 LAB — SAMPLE TO BLOOD BANK

## 2014-08-13 LAB — PROTIME-INR
INR: 1.05 (ref 0.00–1.49)
Prothrombin Time: 13.8 seconds (ref 11.6–15.2)

## 2014-08-13 LAB — CDS SEROLOGY

## 2014-08-13 LAB — ETHANOL: ALCOHOL ETHYL (B): 223 mg/dL — AB (ref 0–9)
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
## 2014-08-14 ENCOUNTER — Encounter: Payer: Self-pay | Admitting: Family Medicine
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
